# Patient Record
Sex: Male | Born: 1980 | Race: White | Hispanic: No | Marital: Married | Smoking: Never smoker
Health system: Southern US, Community
[De-identification: ages and names within clinical notes are randomized; demographics above are authoritative.]

---

## 2018-02-26 ENCOUNTER — Other Ambulatory Visit: Payer: Self-pay | Admitting: Internal Medicine

## 2018-02-26 DIAGNOSIS — E221 Hyperprolactinemia: Secondary | ICD-10-CM

## 2018-02-26 DIAGNOSIS — D352 Benign neoplasm of pituitary gland: Secondary | ICD-10-CM

## 2018-02-28 ENCOUNTER — Other Ambulatory Visit (HOSPITAL_COMMUNITY): Payer: Self-pay | Admitting: Internal Medicine

## 2018-02-28 DIAGNOSIS — E221 Hyperprolactinemia: Secondary | ICD-10-CM

## 2018-02-28 DIAGNOSIS — Z5181 Encounter for therapeutic drug level monitoring: Secondary | ICD-10-CM

## 2018-02-28 DIAGNOSIS — D352 Benign neoplasm of pituitary gland: Secondary | ICD-10-CM

## 2018-03-07 ENCOUNTER — Other Ambulatory Visit (HOSPITAL_COMMUNITY): Payer: Self-pay

## 2018-03-08 ENCOUNTER — Other Ambulatory Visit: Payer: Self-pay

## 2018-11-17 ENCOUNTER — Encounter (HOSPITAL_COMMUNITY): Payer: Self-pay | Admitting: Internal Medicine

## 2018-11-17 ENCOUNTER — Other Ambulatory Visit (HOSPITAL_COMMUNITY): Payer: Self-pay | Admitting: Internal Medicine

## 2018-11-17 DIAGNOSIS — E221 Hyperprolactinemia: Secondary | ICD-10-CM

## 2018-11-17 DIAGNOSIS — D352 Benign neoplasm of pituitary gland: Secondary | ICD-10-CM

## 2018-11-17 DIAGNOSIS — Z5181 Encounter for therapeutic drug level monitoring: Secondary | ICD-10-CM

## 2018-11-19 ENCOUNTER — Ambulatory Visit (HOSPITAL_COMMUNITY): Payer: 59 | Attending: Internal Medicine

## 2018-11-19 ENCOUNTER — Encounter (INDEPENDENT_AMBULATORY_CARE_PROVIDER_SITE_OTHER): Payer: Self-pay

## 2018-11-19 ENCOUNTER — Other Ambulatory Visit: Payer: Self-pay

## 2018-11-19 DIAGNOSIS — E221 Hyperprolactinemia: Secondary | ICD-10-CM

## 2018-11-19 DIAGNOSIS — Z5181 Encounter for therapeutic drug level monitoring: Secondary | ICD-10-CM

## 2018-11-19 DIAGNOSIS — D352 Benign neoplasm of pituitary gland: Secondary | ICD-10-CM

## 2018-12-04 ENCOUNTER — Ambulatory Visit
Admission: RE | Admit: 2018-12-04 | Discharge: 2018-12-04 | Disposition: A | Discharge status: Home / Self Care | Payer: 59 | Source: Ambulatory Visit | Attending: Internal Medicine | Admitting: Internal Medicine

## 2018-12-04 ENCOUNTER — Other Ambulatory Visit: Payer: Self-pay

## 2018-12-04 DIAGNOSIS — D352 Benign neoplasm of pituitary gland: Secondary | ICD-10-CM

## 2018-12-04 DIAGNOSIS — E221 Hyperprolactinemia: Secondary | ICD-10-CM

## 2018-12-04 MED ORDER — GADOBENATE DIMEGLUMINE 529 MG/ML IV SOLN
10.0000 mL | Freq: Once | INTRAVENOUS | Status: AC | PRN
  Administered 2018-12-04: 10 mL via INTRAVENOUS

## 2019-03-11 ENCOUNTER — Other Ambulatory Visit: Payer: Self-pay

## 2019-03-11 DIAGNOSIS — Z20822 Contact with and (suspected) exposure to covid-19: Secondary | ICD-10-CM

## 2019-03-13 LAB — NOVEL CORONAVIRUS, NAA: SARS-CoV-2, NAA: NOT DETECTED

## 2019-07-02 DIAGNOSIS — Z79899 Other long term (current) drug therapy: Secondary | ICD-10-CM | POA: Diagnosis not present

## 2019-07-02 DIAGNOSIS — E230 Hypopituitarism: Secondary | ICD-10-CM | POA: Diagnosis not present

## 2019-07-02 DIAGNOSIS — D352 Benign neoplasm of pituitary gland: Secondary | ICD-10-CM | POA: Diagnosis not present

## 2019-07-02 DIAGNOSIS — E221 Hyperprolactinemia: Secondary | ICD-10-CM | POA: Diagnosis not present

## 2019-12-10 DIAGNOSIS — E230 Hypopituitarism: Secondary | ICD-10-CM | POA: Diagnosis not present

## 2019-12-10 DIAGNOSIS — D352 Benign neoplasm of pituitary gland: Secondary | ICD-10-CM | POA: Diagnosis not present

## 2019-12-10 DIAGNOSIS — I951 Orthostatic hypotension: Secondary | ICD-10-CM | POA: Diagnosis not present

## 2019-12-10 DIAGNOSIS — E221 Hyperprolactinemia: Secondary | ICD-10-CM | POA: Diagnosis not present

## 2019-12-17 ENCOUNTER — Other Ambulatory Visit: Payer: Self-pay | Admitting: Internal Medicine

## 2019-12-17 DIAGNOSIS — D352 Benign neoplasm of pituitary gland: Secondary | ICD-10-CM

## 2019-12-18 DIAGNOSIS — D352 Benign neoplasm of pituitary gland: Secondary | ICD-10-CM | POA: Diagnosis not present

## 2020-01-07 DIAGNOSIS — E221 Hyperprolactinemia: Secondary | ICD-10-CM | POA: Diagnosis not present

## 2020-01-20 DIAGNOSIS — Z20828 Contact with and (suspected) exposure to other viral communicable diseases: Secondary | ICD-10-CM | POA: Diagnosis not present

## 2020-04-03 IMAGING — MR MRI HEAD WITHOUT AND WITH CONTRAST
16 of 19 series · 33 of 48 positions shown · IV contrast (multihance)
Comparison: None.

CLINICAL DATA: Hyperprolactinemia. History of pituitary macro
adenoma with extra sellar extension. No prior surgery.

EXAM:
MRI HEAD WITHOUT AND WITH CONTRAST
TECHNIQUE: Multiplanar, multiecho pulse sequences of the brain and surrounding
structures were obtained without and with intravenous contrast.
CONTRAST:  10mL MULTIHANCE GADOBENATE DIMEGLUMINE 529 MG/ML IV SOLN

[Series 3: T1 · sagittal · 5.0mm · 0.45mm/px · 2 of 23 slices shown]
[im 1/23]
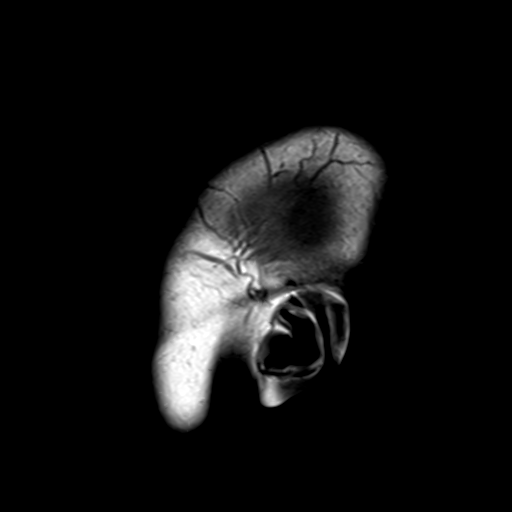
[im 23/23]
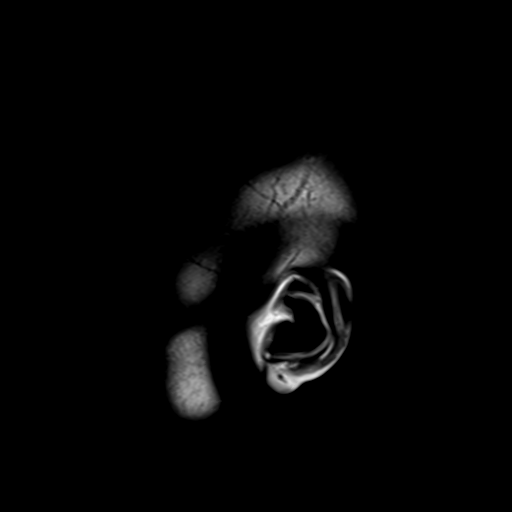

[Series 4: DWI · axial · 3.0mm · 1.80mm/px · z∈[-6,+147]mm · 8 of 104 slices shown]
[im 1/104]
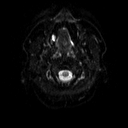
[im 12/104]
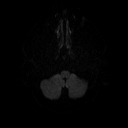
[im 35/104]
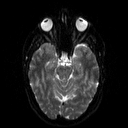
[im 46/104]
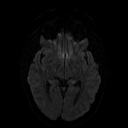
[im 58/104]
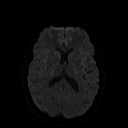
[im 69/104]
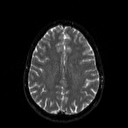
[im 92/104]
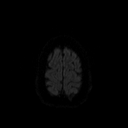
[im 104/104]
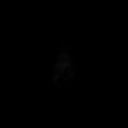

[Series 5: dwi_adc · axial · 3.0mm · 1.80mm/px · z∈[-6,+147]mm · 4 of 46 slices shown]
[im 1/46]
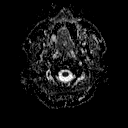
[im 16/46]
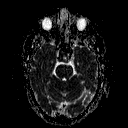
[im 31/46]
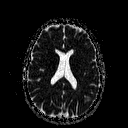
[im 46/46]
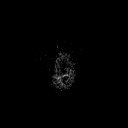

[Series 6: T2 · axial · 5.0mm · 0.36mm/px · z∈[-2,+147]mm · 2 of 24 slices shown]
[im 1/24]
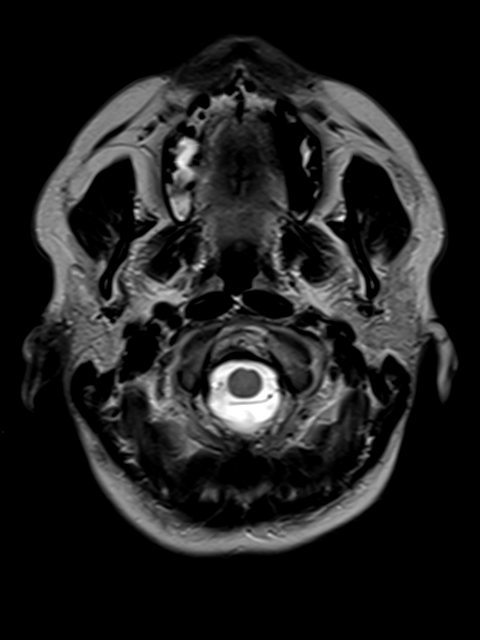
[im 24/24]
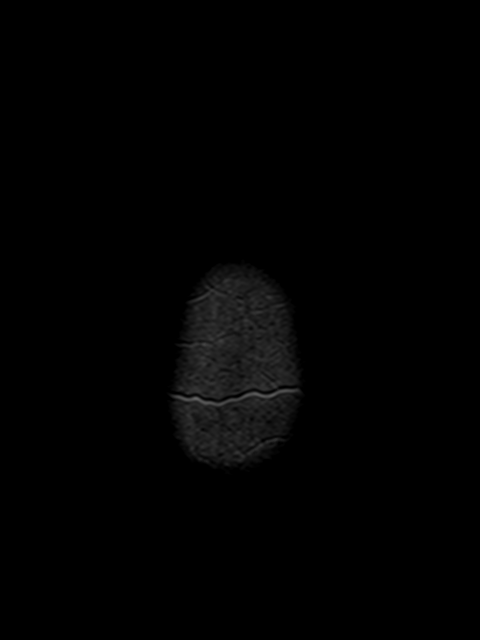

[Series 7: FLAIR · axial · 3.0mm · 0.45mm/px · z∈[-6,+147]mm · 3 of 34 slices shown]
[im 1/34]
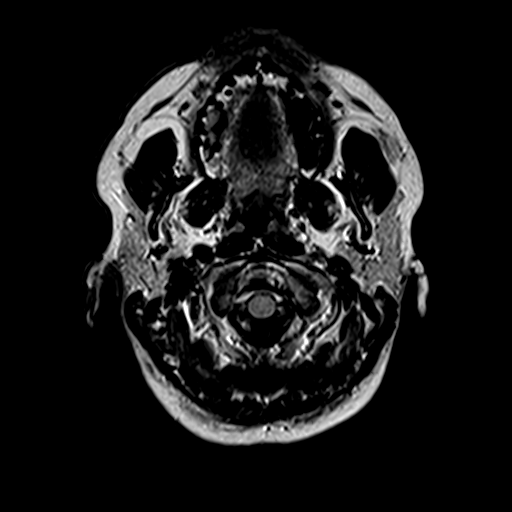
[im 17/34]
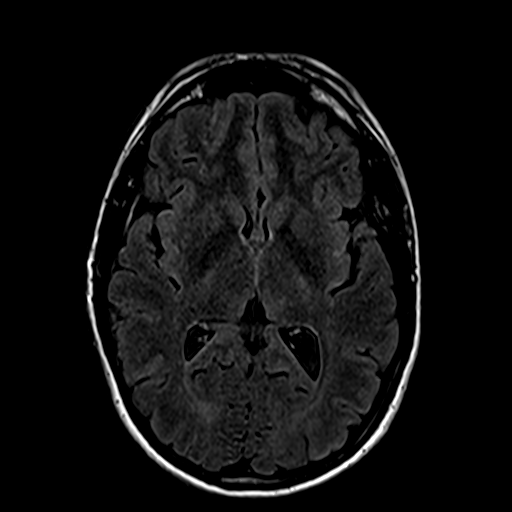
[im 34/34]
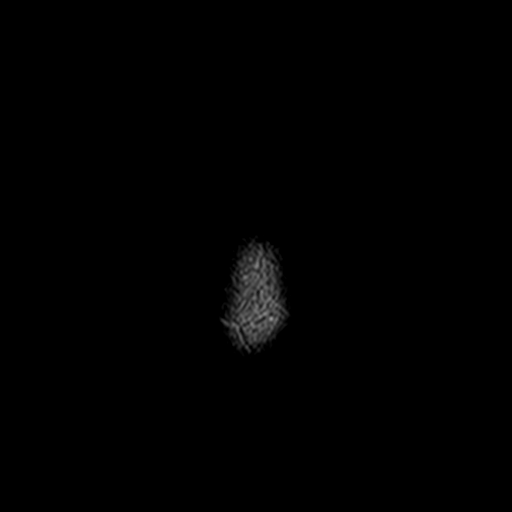

[Series 9: swi_images · axial · 5.0mm · 0.90mm/px · z∈[-6,+149]mm · 3 of 32 slices shown]
[im 1/32]
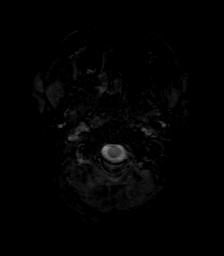
[im 16/32]
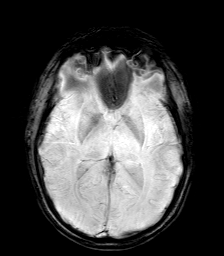
[im 32/32]
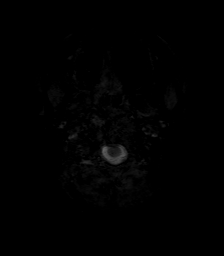

[Series 10: sag 3mm · sagittal · 3.0mm · 0.33mm/px · 1 of 14 slices shown]
[im 1/14]
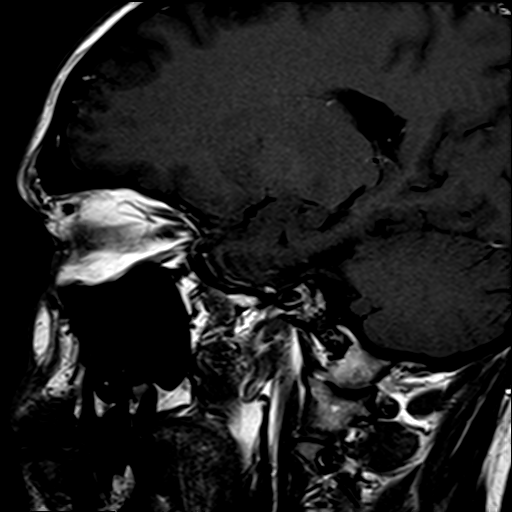

[Series 11: cor 3mm · coronal · 3.0mm · 0.33mm/px · 2 of 16 slices shown]
[im 1/16]
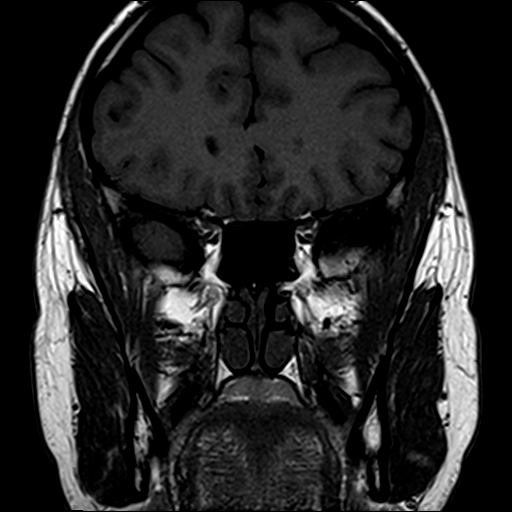
[im 16/16]
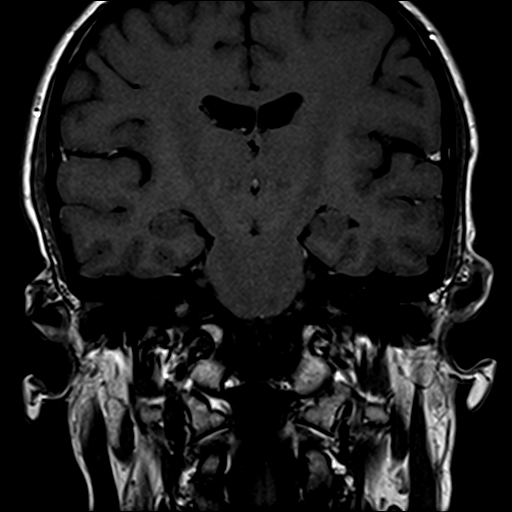

[Series 12: pre cor dynamic · coronal · non-contrast · 3.0mm · 0.35mm/px · 1 of 11 slices shown]
[im 1/11]
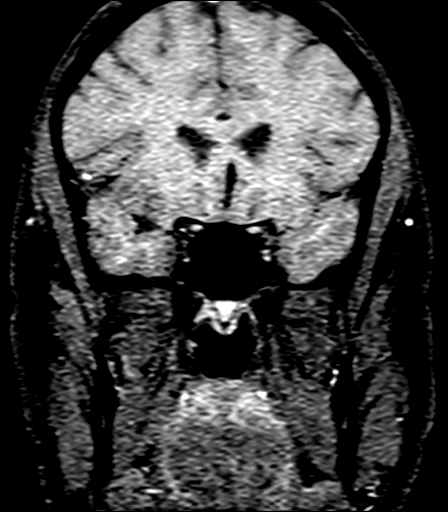

[Series 13: post fs cor · coronal · 3.0mm · 0.35mm/px · 1 of 11 slices shown (1 of 6)]
[im 1/11]
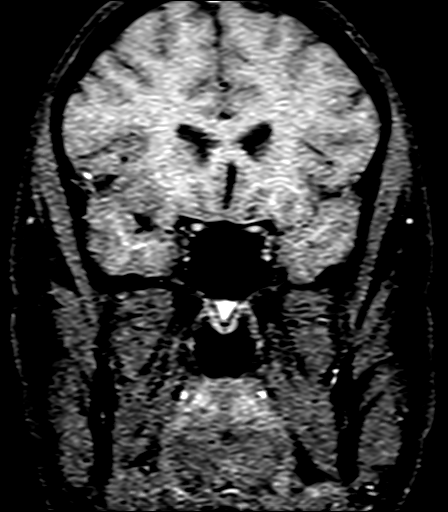

[Series 14: post fs cor · coronal · 3.0mm · 0.35mm/px · 1 of 11 slices shown (2 of 6)]
[im 1/11]
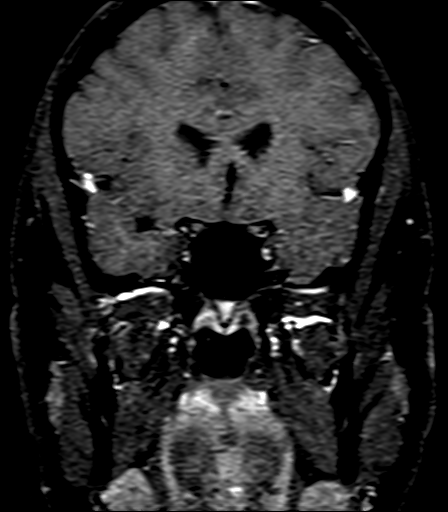

[Series 15: post fs cor · coronal · 3.0mm · 0.35mm/px · 1 of 11 slices shown (3 of 6)]
[im 1/11]
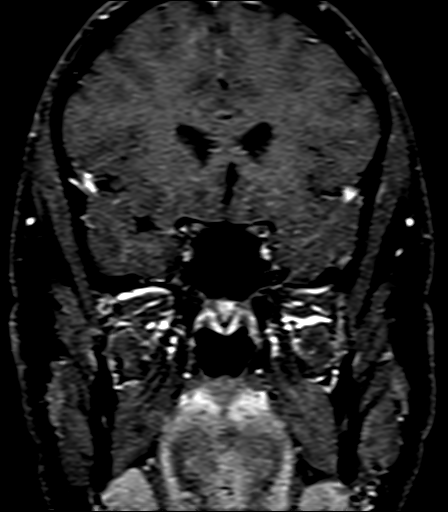

[Series 16: post fs cor · coronal · 3.0mm · 0.35mm/px · 1 of 11 slices shown (4 of 6)]
[im 1/11]
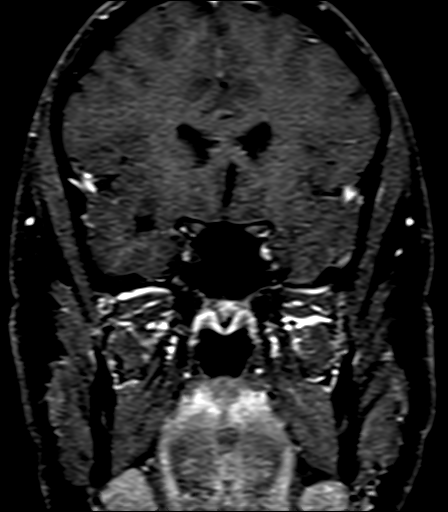

[Series 17: post fs cor · coronal · 3.0mm · 0.35mm/px · 1 of 11 slices shown (5 of 6)]
[im 1/11]
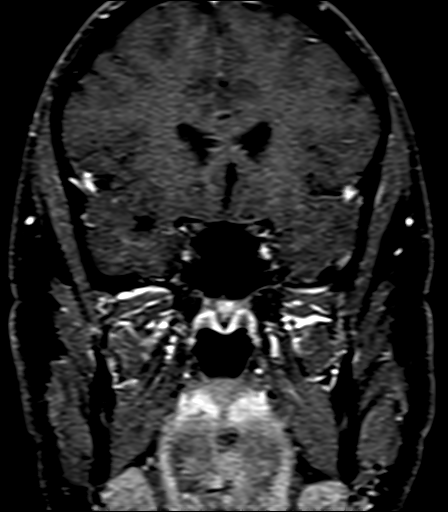

[Series 18: post fs cor · coronal · 3.0mm · 0.35mm/px · 1 of 11 slices shown (6 of 6)]
[im 1/11]
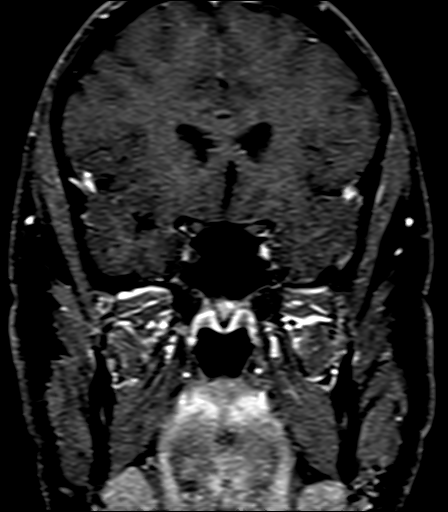

[Series 19: post sag 3mm · sagittal · 3.0mm · 0.33mm/px · 1 of 14 slices shown]
[im 1/14]
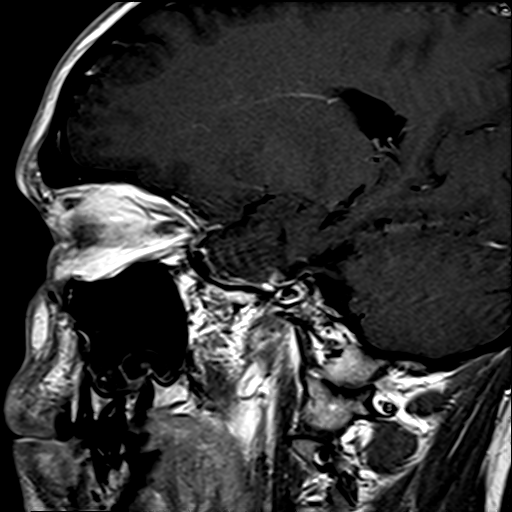

[33 of 48 positions shown; findings below may reference images not displayed]

FINDINGS: Brain: Pituitary protocol. Mass lesion in the right sella measures
approximately 17 x 9 mm. The sella is depressed on the right. Tumor
extends into the right sphenoid sinus and upper clivus. There is
lesion extending into the right cavernous sinus. Infundibulum mildly
displaced to the left. No compression of the optic chiasm. Left
cavernous sinus normal.

Ventricle size normal. Negative for infarct or hemorrhage. White
matter normal.

Vascular: Normal arterial flow voids.  Normal venous enhancement.

Skull and upper cervical spine: Negative

Sinuses/Orbits: Mild mucosal edema base of maxillary sinus
bilaterally. Normal orbit

Other: None.
IMPRESSION: Findings compatible with pituitary macro adenoma on the right. Tumor
measures approximately 17 x 9 mm with small extension into the right
cavernous sinus. There is depression of the optic chiasm with tumor
extension into the sphenoid sinus and upper clivus.

Otherwise normal brain.

## 2020-04-05 DIAGNOSIS — E230 Hypopituitarism: Secondary | ICD-10-CM | POA: Diagnosis not present

## 2020-04-05 DIAGNOSIS — E221 Hyperprolactinemia: Secondary | ICD-10-CM | POA: Diagnosis not present

## 2020-04-05 DIAGNOSIS — D352 Benign neoplasm of pituitary gland: Secondary | ICD-10-CM | POA: Diagnosis not present

## 2020-04-11 ENCOUNTER — Ambulatory Visit
Admission: RE | Admit: 2020-04-11 | Discharge: 2020-04-11 | Disposition: A | Discharge status: Home / Self Care | Payer: 59 | Source: Ambulatory Visit | Attending: Internal Medicine | Admitting: Internal Medicine

## 2020-04-11 ENCOUNTER — Other Ambulatory Visit: Payer: Self-pay

## 2020-04-11 DIAGNOSIS — D352 Benign neoplasm of pituitary gland: Secondary | ICD-10-CM

## 2020-04-11 MED ORDER — GADOBUTROL 1 MMOL/ML IV SOLN
8.0000 mL | Freq: Once | INTRAVENOUS | Status: AC | PRN
  Administered 2020-04-11: 8 mL via INTRAVENOUS

## 2020-05-18 DIAGNOSIS — J029 Acute pharyngitis, unspecified: Secondary | ICD-10-CM | POA: Diagnosis not present

## 2020-05-18 DIAGNOSIS — M791 Myalgia, unspecified site: Secondary | ICD-10-CM | POA: Diagnosis not present

## 2020-05-31 DIAGNOSIS — L255 Unspecified contact dermatitis due to plants, except food: Secondary | ICD-10-CM | POA: Diagnosis not present

## 2020-09-26 DIAGNOSIS — A6920 Lyme disease, unspecified: Secondary | ICD-10-CM | POA: Diagnosis not present

## 2021-03-31 DIAGNOSIS — Z125 Encounter for screening for malignant neoplasm of prostate: Secondary | ICD-10-CM | POA: Diagnosis not present

## 2021-03-31 DIAGNOSIS — Z0000 Encounter for general adult medical examination without abnormal findings: Secondary | ICD-10-CM | POA: Diagnosis not present

## 2021-03-31 DIAGNOSIS — Z13220 Encounter for screening for lipoid disorders: Secondary | ICD-10-CM | POA: Diagnosis not present

## 2021-03-31 DIAGNOSIS — R6882 Decreased libido: Secondary | ICD-10-CM | POA: Diagnosis not present

## 2021-03-31 DIAGNOSIS — E559 Vitamin D deficiency, unspecified: Secondary | ICD-10-CM | POA: Diagnosis not present

## 2021-04-05 DIAGNOSIS — D352 Benign neoplasm of pituitary gland: Secondary | ICD-10-CM | POA: Diagnosis not present

## 2021-04-05 DIAGNOSIS — E221 Hyperprolactinemia: Secondary | ICD-10-CM | POA: Diagnosis not present

## 2021-05-29 ENCOUNTER — Encounter: Payer: Self-pay | Admitting: *Deleted

## 2021-05-30 ENCOUNTER — Ambulatory Visit: Payer: BC Managed Care – PPO | Admitting: Neurology

## 2021-05-30 ENCOUNTER — Encounter: Payer: Self-pay | Admitting: Neurology

## 2021-05-30 VITALS — BP 103/65 | HR 49 | Ht 71.0 in | Wt 191.0 lb

## 2021-05-30 DIAGNOSIS — R0689 Other abnormalities of breathing: Secondary | ICD-10-CM | POA: Diagnosis not present

## 2021-05-30 DIAGNOSIS — G4761 Periodic limb movement disorder: Secondary | ICD-10-CM

## 2021-05-30 DIAGNOSIS — G2581 Restless legs syndrome: Secondary | ICD-10-CM

## 2021-05-30 DIAGNOSIS — R0683 Snoring: Secondary | ICD-10-CM

## 2021-05-30 DIAGNOSIS — R0681 Apnea, not elsewhere classified: Secondary | ICD-10-CM | POA: Diagnosis not present

## 2021-05-30 DIAGNOSIS — R351 Nocturia: Secondary | ICD-10-CM | POA: Diagnosis not present

## 2021-05-30 DIAGNOSIS — Z820 Family history of epilepsy and other diseases of the nervous system: Secondary | ICD-10-CM

## 2021-05-30 DIAGNOSIS — D352 Benign neoplasm of pituitary gland: Secondary | ICD-10-CM

## 2021-05-30 DIAGNOSIS — Z9189 Other specified personal risk factors, not elsewhere classified: Secondary | ICD-10-CM

## 2021-05-30 NOTE — Progress Notes (Signed)
Subjective:    Patient ID: Donald Bennett is a 41 y.o. male.  HPI    Star Age, MD, PhD [facility] [address] P.O. [place], [place] [address]  Dear Donald Bennett,   I saw your patient, Donald Bennett, upon your kind request, in my Sleep clinic today for initial consultation of his sleep disorder, in particular, concern for underlying obstructive sleep apnea.  The patient is accompanied by his wife today.  As you know, Donald Bennett is a 41 year old right-handed gentleman with an underlying medical history of pituitary macroadenoma with hyperprolactinemia, vitamin D deficiency, orthostatic hypotension, and mildly overweight state, who reports snoring and difficulty sleeping at night, as well as witnessed apneas.  I reviewed your office note 03/31/2021.  His Epworth sleepiness score is 10/24, fatigue severity score is 40 out of 63.  His dad has sleep apnea and has a CPAP or similar machine.  Over the past few years his daytime somnolence has increased.  His wife is also noted that he tends to twitch his feet while asleep and also tremble in his feet.  He endorses intermittent symptoms of restless leg syndrome.  His bedtime is generally around 11 PM and rise time around 6:30 AM.  He lives with his wife and 4 children, they have no indoor pets.  He is a non-smoker and drinks alcohol occasionally, maybe once or twice a month.  He drinks caffeine in the form of iced tea, 32 ounces per day on average.  He denies recurrent headaches or morning headaches but has nocturia about once per average night, he had a tonsillectomy as a child.  He has a history of bradycardia.  He used to run on a regular basis.  He does have a tendency to move his feet when sitting.  He has occasionally woken up with a sense of gasping for air.  His wife has noted the occasional pauses in his snoring pattern.  He works as an Optometrist.  He denies any significant stress or anxiety.  He does not have  difficulty falling asleep but does have some difficulty staying asleep, it may take him an hour before he falls asleep again.  Since he has been started on vitamin D supplementation, his leg twitching may have improved a little bit.  He reports that he has a tendency to be anemic, in the past, when he tried to give blood, he was sometimes advised he could not because he was anemic.  He is followed by endocrinology and is on cabergoline.  He is not on any other prescription medication.  His Past Medical History Is Significant For: History reviewed. No pertinent past medical history.  His Past Surgical History Is Significant For: History reviewed. No pertinent surgical history.  His Family History Is Significant For: Family History  Problem Relation Age of Onset   Atrial fibrillation Mother    Sleep apnea Father    Skin cancer Father     His Social History Is Significant For: Social History   Socioeconomic History   Marital status: Married    Spouse name: Not on file   Number of children: Not on file   Years of education: Not on file   Highest education level: Not on file  Occupational History   Not on file  Tobacco Use   Smoking status: Never   Smokeless tobacco: Never  Vaping Use   Vaping Use: Never used  Substance and Sexual Activity   Alcohol use: Yes  Comment: 1-2 X A WEEK   Drug use: Never   Sexual activity: Not on file  Other Topics Concern   Not on file  Social History Narrative   Caffeine moderate 1-2 x week soda. .  Married. 4 kids,  works at [employer].  Education; Programme researcher, broadcasting/film/video Determinants of Radio broadcast assistant Strain: Not on file  Food Insecurity: Not on file  Transportation Needs: Not on file  Physical Activity: Not on file  Stress: Not on file  Social Connections: Not on file    His Allergies Are:  No Known Allergies:   His Current Medications Are:  Outpatient Encounter Medications as of 05/30/2021  Medication Sig   cabergoline  (DOSTINEX) 0.5 MG tablet Take 0.5 mg by mouth daily.   No facility-administered encounter medications on file as of 05/30/2021.  :   Review of Systems:  Out of a complete 14 point review of systems, all are reviewed and negative with the exception of these symptoms as listed below:  Review of Systems  Neurological:        Possible sleep apnea, legs quivering at night, snoring, with pauses, daytime tiredness.  ESS10 FSS 40.    Objective:  Neurological Exam  Physical Exam Physical Examination:   Vitals:   05/30/21 1253  BP: 103/65  Pulse: (!) 49    General Examination: The patient is a very pleasant 41 y.o. male in no acute distress. He appears well-developed and well-nourished and well groomed.   HEENT: Normocephalic, atraumatic, pupils are equal, round and reactive to light, extraocular tracking is good without limitation to gaze excursion or nystagmus noted. Hearing is grossly intact. Face is symmetric with normal facial animation. Speech is clear with no dysarthria noted. There is no hypophonia. There is no lip, neck/head, jaw or voice tremor. Neck is supple with full range of passive and active motion. There are no carotid bruits on auscultation. Oropharynx exam reveals: mild mouth dryness, good dental hygiene and mild airway crowding, due to small airway entry, redundant soft palate.  Mallampati class III, tonsils absent.  Neck circumference of 15 1/8 inches.  Minimal overbite.  Tongue protrudes centrally and palate elevates symmetrically.  Chest: Clear to auscultation without wheezing, rhonchi or crackles noted.  Heart: S1+S2+0, regular and normal without murmurs, rubs or gallops noted.  Mild, asymptomatic bradycardia.  Abdomen: Soft, non-tender and non-distendedwith normal bowel sounds appreciated on auscultation.  Extremities: There is no pitting edema in the distal lower extremities bilaterally.   Skin: Warm and dry without trophic changes noted.   Musculoskeletal: exam  reveals no obvious joint deformities.   Neurologically:  Mental status: The patient is awake, alert and oriented in all 4 spheres. His immediate and remote memory, attention, language skills and fund of knowledge are appropriate. There is no evidence of aphasia, agnosia, apraxia or anomia. Speech is clear with normal prosody and enunciation. Thought process is linear. Mood is normal and affect is normal.  Cranial nerves II - XII are as described above under HEENT exam.  Motor exam: Normal bulk, strength and tone is noted. There is no tremor. Fine motor skills and coordination: grossly intact.  Cerebellar testing: No dysmetria or intention tremor. There is no truncal or gait ataxia.  Sensory exam: intact to light touch in the upper and lower extremities.  Gait, station and balance: He stands easily. No veering to one side is noted. No leaning to one side is noted. Posture is age-appropriate and stance is narrow based.  Gait shows normal stride length and normal pace. No problems turning are noted.   Assessment and Plan:  In summary, Donald Bennett is a very pleasant 41 y.o.-year old male ith an underlying medical history of pituitary macroadenoma with hyperprolactinemia, vitamin D deficiency, orthostatic hypotension, and mildly overweight state, whose history and physical exam are concerning for obstructive sleep apnea (OSA). I had a long chat with the patient and his wife about my findings and the diagnosis of OSA, its prognosis and treatment options. We talked about medical treatments, surgical interventions and non-pharmacological approaches. I explained in particular the risks and ramifications of untreated moderate to severe OSA, especially with respect to developing cardiovascular disease down the Road, including congestive heart failure, difficult to treat hypertension, cardiac arrhythmias, or stroke. Even type 2 diabetes has, in part, been linked to untreated OSA. Symptoms of untreated OSA include  daytime sleepiness, memory problems, mood irritability and mood disorder such as depression and anxiety, lack of energy, as well as recurrent headaches, especially morning headaches. We talked about trying to maintain a healthy lifestyle in general, as well as the importance of weight control. We also talked about the importance of good sleep hygiene. I recommended the following at this time: sleep study.  I outlined the differences between a laboratory attended sleep study versus home sleep test.  Given his history of restless leg symptoms as well as leg movements at night, a laboratory attended sleep study would make sense. I explained the sleep test procedure to the patient and also outlined possible surgical and non-surgical treatment options of OSA, including the use of a custom-made dental device (which would require a referral to a specialist dentist or oral surgeon), upper airway surgical options, such as traditional UPPP or a novel less invasive surgical option in the form of Inspire hypoglossal nerve stimulation (which would involve a referral to an ENT surgeon). I also explained the CPAP treatment option to the patient, who indicated that he would be willing to try CPAP if the need arises. I explained the importance of being compliant with PAP treatment, not only for insurance purposes but primarily to improve His symptoms, and for the patient's long term health benefit, including to reduce His cardiovascular risks. I answered all their questions today and the patient and his wife were in agreement. I plan to see him back after the sleep study is completed and encouraged them to call with any interim questions, concerns, problems or updates.   Thank you very much for allowing me to participate in the care of this nice patient. If I can be of any further assistance to you please do not hesitate to call me at [phone].  Sincerely,   Star Age, MD, PhD

## 2021-05-30 NOTE — Patient Instructions (Signed)

## 2021-06-06 ENCOUNTER — Telehealth: Payer: Self-pay

## 2021-06-06 NOTE — Telephone Encounter (Signed)
LVM for pt to call me back to schedule sleep study  

## 2021-06-12 ENCOUNTER — Telehealth: Payer: Self-pay

## 2021-06-12 NOTE — Telephone Encounter (Signed)
Returned pt's call and LVM for pt to call me back to schedule sleep study ° °

## 2021-06-15 ENCOUNTER — Other Ambulatory Visit: Payer: Self-pay

## 2021-06-15 ENCOUNTER — Ambulatory Visit (INDEPENDENT_AMBULATORY_CARE_PROVIDER_SITE_OTHER): Payer: BC Managed Care – PPO | Admitting: Neurology

## 2021-06-15 DIAGNOSIS — G4733 Obstructive sleep apnea (adult) (pediatric): Secondary | ICD-10-CM

## 2021-06-15 DIAGNOSIS — Z820 Family history of epilepsy and other diseases of the nervous system: Secondary | ICD-10-CM

## 2021-06-15 DIAGNOSIS — Z9189 Other specified personal risk factors, not elsewhere classified: Secondary | ICD-10-CM

## 2021-06-15 DIAGNOSIS — R0683 Snoring: Secondary | ICD-10-CM

## 2021-06-15 DIAGNOSIS — G4720 Circadian rhythm sleep disorder, unspecified type: Secondary | ICD-10-CM

## 2021-06-15 DIAGNOSIS — G4761 Periodic limb movement disorder: Secondary | ICD-10-CM

## 2021-06-15 DIAGNOSIS — R0689 Other abnormalities of breathing: Secondary | ICD-10-CM

## 2021-06-15 DIAGNOSIS — R351 Nocturia: Secondary | ICD-10-CM

## 2021-06-15 DIAGNOSIS — D352 Benign neoplasm of pituitary gland: Secondary | ICD-10-CM

## 2021-06-15 DIAGNOSIS — R0681 Apnea, not elsewhere classified: Secondary | ICD-10-CM

## 2021-06-15 DIAGNOSIS — G2581 Restless legs syndrome: Secondary | ICD-10-CM

## 2021-06-22 ENCOUNTER — Encounter: Payer: Self-pay | Admitting: Neurology

## 2021-06-22 NOTE — Procedures (Signed)
PATIENT'S NAME:  Donald Bennett, Donald Bennett DOB:      02-09-1981      MR#:    [id number]     DATE OF RECORDING: 06/15/2021 REFERRING M.D.:  Gaynelle Cage, [place] Study Performed:   Baseline Polysomnogram HISTORY: 41 year old man with a history of pituitary macroadenoma with hyperprolactinemia, vitamin D deficiency, orthostatic hypotension, and mildly overweight state, who reports snoring and difficulty sleeping at night, as well as witnessed apneas. The patient endorsed the Epworth Sleepiness Scale at 10 points. The patient's weight 191 pounds with a height of 71 (inches), resulting in a BMI of 26.9 kg/m2. The patient's neck circumference measured 15.2 inches.  CURRENT MEDICATIONS: Dostinex   PROCEDURE:  This is a multichannel digital polysomnogram utilizing the Somnostar 11.2 system.  Electrodes and sensors were applied and monitored per AASM Specifications.   EEG, EOG, Chin and Limb EMG, were sampled at 200 Hz.  ECG, Snore and Nasal Pressure, Thermal Airflow, Respiratory Effort, CPAP Flow and Pressure, Oximetry was sampled at 50 Hz. Digital video and audio were recorded.      BASELINE STUDY  Lights Out was at 21:49 and Lights On at 05:05.  Total recording time (TRT) was 437 minutes, with a total sleep time (TST) of 397 minutes.   The patient's sleep latency was 22 minutes.  REM latency was 83.5 minutes, which is normal.  The sleep efficiency was 90.8 %.     SLEEP ARCHITECTURE: WASO (Wake after sleep onset) was 36 minutes with mild sleep fragmentation noted.  There were 11.5 minutes in Stage N1, 286 minutes Stage N2, 19 minutes Stage N3 and 80.5 minutes in Stage REM.  The percentage of Stage N1 was 2.9%, Stage N2 was 72.%, which is increased, Stage N3 was 4.8% and Stage R (REM sleep) was 20.3%, which is normal. The arousals were noted as: 76 were spontaneous, 0 were associated with PLMs, 1 were associated with respiratory events.  RESPIRATORY ANALYSIS:  There were a total of 4 respiratory events:  0 obstructive  apneas, 0 central apneas and 1 mixed apneas with a total of 1 apneas and an apnea index (AI) of .2 /hour. There were 3 hypopneas with a hypopnea index of .5 /hour. The patient also had 0 respiratory event related arousals (RERAs).      The total APNEA/HYPOPNEA INDEX (AHI) was .6/hour and the total RESPIRATORY DISTURBANCE INDEX was  .6 /hour.  3 events occurred in REM sleep and 2 events in NREM. The REM AHI was  2.2 /hour, versus a non-REM AHI of .2. The patient spent 250 minutes of total sleep time in the supine position and 147 minutes in non-supine.. The supine AHI was 0.5 versus a non-supine AHI of 0.8.  OXYGEN SATURATION & C02:  The Wake baseline 02 saturation was 92%, with the lowest being 87%. Time spent below 89% saturation equaled 0 minutes.  PERIODIC LIMB MOVEMENTS: The patient had a total of 0 Periodic Limb Movements.  The Periodic Limb Movement (PLM) index was 0 and the PLM Arousal index was 0/hour.  Audio and video analysis did not show any abnormal or unusual movements, behaviors, phonations or vocalizations. The patient took no bathroom breaks. Mild snoring was noted. The EKG was in keeping with normal sinus rhythm (NSR).  Post-study, the patient indicated that sleep was worse than usual.   IMPRESSION:  Primary Snoring Dysfunctions associated with sleep stages or arousal from sleep  RECOMMENDATIONS:  This study does not demonstrate any significant obstructive or central sleep disordered breathing with the  exception of mild snoring. Some weight loss and avoiding the supine sleep position may alleviate snoring. This study does not support an intrinsic sleep disorder as a cause of the patient's symptoms. Other causes, including circadian rhythm disturbances, an underlying mood disorder, medication effect and/or an underlying medical problem cannot be ruled out. This study shows sleep fragmentation and mildly abnormal sleep stage percentages; these are nonspecific findings and per se do  not signify an intrinsic sleep disorder or a cause for the patient's sleep-related symptoms. Causes include (but are not limited to) the first night effect of the sleep study, circadian rhythm disturbances, medication effect or an underlying mood disorder or medical problem.  The patient should be cautioned not to drive, work at heights, or operate dangerous or heavy equipment when tired or sleepy. Review and reiteration of good sleep hygiene measures should be pursued with any patient. The patient will be advised to follow up with the referring provider, who will be notified of the test results.  I certify that I have reviewed the entire raw data recording prior to the issuance of this report in accordance with the Standards of Accreditation of the American Academy of Sleep Medicine (AASM)  Star Age, MD, PhD Diplomat, American Board of Neurology and Sleep Medicine (Neurology and Sleep Medicine)

## 2022-04-30 DIAGNOSIS — E221 Hyperprolactinemia: Secondary | ICD-10-CM | POA: Diagnosis not present

## 2022-04-30 DIAGNOSIS — D352 Benign neoplasm of pituitary gland: Secondary | ICD-10-CM | POA: Diagnosis not present

## 2022-06-11 DIAGNOSIS — E221 Hyperprolactinemia: Secondary | ICD-10-CM | POA: Diagnosis not present

## 2022-06-11 DIAGNOSIS — D352 Benign neoplasm of pituitary gland: Secondary | ICD-10-CM | POA: Diagnosis not present

## 2022-06-11 DIAGNOSIS — E230 Hypopituitarism: Secondary | ICD-10-CM | POA: Diagnosis not present

## 2022-11-29 DIAGNOSIS — U071 COVID-19: Secondary | ICD-10-CM | POA: Diagnosis not present

## 2022-11-29 DIAGNOSIS — J069 Acute upper respiratory infection, unspecified: Secondary | ICD-10-CM | POA: Diagnosis not present

## 2023-02-12 DIAGNOSIS — Z1329 Encounter for screening for other suspected endocrine disorder: Secondary | ICD-10-CM | POA: Diagnosis not present

## 2023-02-12 DIAGNOSIS — D649 Anemia, unspecified: Secondary | ICD-10-CM | POA: Diagnosis not present

## 2023-02-12 DIAGNOSIS — Z13220 Encounter for screening for lipoid disorders: Secondary | ICD-10-CM | POA: Diagnosis not present

## 2023-02-12 DIAGNOSIS — D352 Benign neoplasm of pituitary gland: Secondary | ICD-10-CM | POA: Diagnosis not present

## 2023-02-12 DIAGNOSIS — Z125 Encounter for screening for malignant neoplasm of prostate: Secondary | ICD-10-CM | POA: Diagnosis not present

## 2023-02-12 DIAGNOSIS — Z0000 Encounter for general adult medical examination without abnormal findings: Secondary | ICD-10-CM | POA: Diagnosis not present

## 2023-04-25 DIAGNOSIS — J069 Acute upper respiratory infection, unspecified: Secondary | ICD-10-CM | POA: Diagnosis not present

## 2023-06-05 DIAGNOSIS — E221 Hyperprolactinemia: Secondary | ICD-10-CM | POA: Diagnosis not present

## 2023-06-05 DIAGNOSIS — E230 Hypopituitarism: Secondary | ICD-10-CM | POA: Diagnosis not present

## 2023-06-05 DIAGNOSIS — D352 Benign neoplasm of pituitary gland: Secondary | ICD-10-CM | POA: Diagnosis not present

## 2023-06-12 DIAGNOSIS — E230 Hypopituitarism: Secondary | ICD-10-CM | POA: Diagnosis not present

## 2023-06-12 DIAGNOSIS — D352 Benign neoplasm of pituitary gland: Secondary | ICD-10-CM | POA: Diagnosis not present

## 2023-06-12 DIAGNOSIS — E221 Hyperprolactinemia: Secondary | ICD-10-CM | POA: Diagnosis not present

## 2023-06-12 DIAGNOSIS — D649 Anemia, unspecified: Secondary | ICD-10-CM | POA: Diagnosis not present

## 2023-06-13 ENCOUNTER — Other Ambulatory Visit: Payer: Self-pay | Admitting: Internal Medicine

## 2023-06-13 DIAGNOSIS — D352 Benign neoplasm of pituitary gland: Secondary | ICD-10-CM

## 2023-10-29 DIAGNOSIS — L570 Actinic keratosis: Secondary | ICD-10-CM | POA: Diagnosis not present

## 2023-10-29 DIAGNOSIS — Z7189 Other specified counseling: Secondary | ICD-10-CM | POA: Diagnosis not present

## 2023-10-29 DIAGNOSIS — D492 Neoplasm of unspecified behavior of bone, soft tissue, and skin: Secondary | ICD-10-CM | POA: Diagnosis not present

## 2023-10-29 DIAGNOSIS — L821 Other seborrheic keratosis: Secondary | ICD-10-CM | POA: Diagnosis not present

## 2023-10-29 DIAGNOSIS — C44719 Basal cell carcinoma of skin of left lower limb, including hip: Secondary | ICD-10-CM | POA: Diagnosis not present

## 2023-10-29 DIAGNOSIS — D225 Melanocytic nevi of trunk: Secondary | ICD-10-CM | POA: Diagnosis not present

## 2023-10-29 DIAGNOSIS — C44519 Basal cell carcinoma of skin of other part of trunk: Secondary | ICD-10-CM | POA: Diagnosis not present

## 2023-10-29 DIAGNOSIS — L814 Other melanin hyperpigmentation: Secondary | ICD-10-CM | POA: Diagnosis not present

## 2023-12-06 DIAGNOSIS — C44719 Basal cell carcinoma of skin of left lower limb, including hip: Secondary | ICD-10-CM | POA: Diagnosis not present

## 2023-12-06 DIAGNOSIS — C44519 Basal cell carcinoma of skin of other part of trunk: Secondary | ICD-10-CM | POA: Diagnosis not present

## 2024-02-13 DIAGNOSIS — D649 Anemia, unspecified: Secondary | ICD-10-CM | POA: Diagnosis not present

## 2024-02-13 DIAGNOSIS — Z125 Encounter for screening for malignant neoplasm of prostate: Secondary | ICD-10-CM | POA: Diagnosis not present

## 2024-02-13 DIAGNOSIS — Z0000 Encounter for general adult medical examination without abnormal findings: Secondary | ICD-10-CM | POA: Diagnosis not present

## 2024-02-13 DIAGNOSIS — Z13220 Encounter for screening for lipoid disorders: Secondary | ICD-10-CM | POA: Diagnosis not present

## 2024-02-13 DIAGNOSIS — Z1329 Encounter for screening for other suspected endocrine disorder: Secondary | ICD-10-CM | POA: Diagnosis not present
